# Patient Record
Sex: Female | Born: 1970 | Race: Asian | Hispanic: No
Health system: Western US, Academic
[De-identification: ages and names within clinical notes are randomized; demographics above are authoritative.]

## PROBLEM LIST (undated history)

## (undated) HISTORY — PX: PR LIG/TRNSXJ FLP TUBE ABDL/VAG APPR UNI/BI: 58600

## (undated) HISTORY — PX: ECTOPIC PREGNANCY SCREEN: ECTOP

## (undated) NOTE — Telephone Encounter (Signed)
Formatting of this note might be different from the original.  TEAM A    ACTION NEEDED (be specific):    Please review and sign refill Losartan  Pt does not have KP insurance    Action Completed:  Pended Losartan  When was this issue last addressed? (Include link to relevant encounter): Preventative Visit with Jaye Beagle, MD (12/01/2021)   Most recent plan if applicable (copy/paste):    Hypertension, unspecified type  Comment: almost due for monitoring labs. BP is well controlled  Plan: CREATININE, POTASSIUM    Relevant labs, imaging/studies, referrals, meds:   POTASSIUM (12/01/2021 9:55 AM)   CREATININE (12/01/2021 9:55 AM)   POTASSIUM (01/07/2021 11:29 AM)   CREATININE (01/07/2021 11:29 AM)     Future f/u appointments: No future appointments.    AFTER YOUR ADVICE OR ACTION, PLEASE: Return to [facility] MA Team A       Electronically signed by Teresa Coombs, MA at 12/20/2022  1:22 PM PDT

## (undated) NOTE — Telephone Encounter (Signed)
Signed Prescriptions: Disp Refills   losartan (COZAAR) 25 mg tablet 30 tab*0   Sig: Take 1 tablet (25 mg) by mouth daily  Authorizing Provider: Frederico Hamman    -------------------------------------------------------------------    Electronically signed by Frederico Hamman, MD at 12/20/2022  1:48 PM PDT

## (undated) NOTE — Telephone Encounter (Signed)
Formatting of this note might be different from the original.  Labs due for further refills.   30 day supply given.     Vickey Sages, MD  Board Certified Family Physician  [facility]    Electronically signed by Frederico Hamman, MD at 12/20/2022  1:48 PM PDT

## (undated) NOTE — Progress Notes (Signed)
 Formatting of this note is different from the original.    Subjective:  This 66 year old female presents today for a well adult visit.    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     No past medical history on file.    Past Surgical History:   Procedure Laterality Date   ? TUBAL LIGATION  06/20/1995     Social History     Tobacco Use   ? Smoking status: Never Smoker   ? Smokeless tobacco: Never Used   Substance and Sexual Activity   ? Alcohol use: Yes     Comment: wine once a week   ? Drug use: Never   ? Sexual activity: Yes     Partners: Male     Comment: [id number]     Outpatient Medications Marked as Taking for the 01/07/21 encounter (Preventative Visit) with Cynda Acres, MD   Medication Sig Dispense Refill   ? losartan (COZAAR) 25 mg tablet Take 1 tablet (25 mg) by mouth daily 90 tablet 3     Allergies:  Allergies   Allergen Reactions   ? Metronidazole Rash   ? Penicillins Other     Pt states got sick, cant remember      Immunization History   Administered Date(s) Administered   ? *STANDARD DOSE SYRINGE* (FluLAVAL,FluZONE,FluARIX or AFLURIA) QUAD 03/16/2015   ? Moderna SARS-CoV-2 Vaccination (12 + y/o)(RED CAP) 10/18/2019, 11/21/2019   ? Tdap (Tetanus, Diphtheria, acellular Pertussis) 10/10/2017     Past medical and surgical history reviewed and updated in Epic Yes  Family history reviewed and updated in Epic Yes    QUESTIONNAIRE REVIEW:   Most Recent Health  Profile Encounter (68yr): Not Found    I have reviewed and noted areas of concern. Wellness Questionnaire entered by my staff      PHYSICAL EXAM:  Visit Vitals  BP 132/78   Pulse 66   Temp 98.1 F (36.7 C) (Temporal)   Ht 5' 2.6" (1.59 m)   Wt 152 lb (68.9 kg)   LMP 12/17/2020   SpO2 99%   BMI 27.27 kg/m     Estimated body mass index is 27.27 kg/m as calculated from the following:    Height as of this encounter: 5' 2.6" (1.59 m).    Weight as of this encounter: 152 lb (68.9 kg).    General Appearance: alert, well developed, well nourished, cooperative and in no apparent distress  SKIN: skin color, texture, and turgor normal for age; no worrisome lesions detected  HEENT: ears, nose and throat normal, neck supple without adenopathy or masses  THYROID: normal to inspection and palpation  LUNGS: normal and clear to  auscultation  CV: regular rate and rhythm and without murmur  ABDOMEN: abdomen soft, non-tender. BS normal. No masses, organomegaly  EXTREMITIES: unremarkable    BREASTS: inspection negative. No nipple discharge or bleeding. No mass, no axillary lymphadenopathy    PELVIC: Not Indicated    ASSESSMENT/PLAN:    58 year old female here for well-care visit  Problems Identified/Plan:   (Z00.00) Routine general medical examination at a health care facility  (primary encounter diagnosis)    (I10) Hypertension, unspecified type  Plan: losartan (COZAAR) 25 mg tablet    (Z12.31) Encounter for screening mammogram for malignant neoplasm of breast  Plan: MAMMOGRAPHY SCREENING 3D, BILATERAL    Information provided included:  Cancer Screening (breast, colon, cervical)  Electronically signed by Cynda Acres, MD at 01/07/2021 10:59 AM PDT

## (undated) NOTE — Telephone Encounter (Signed)
Formatting of this note might be different from the original.  losartan (COZAAR) 25 mg tablet (12/20/2022 (O))   Patient Message with Guido Sander, MA (12/20/2022)   Electronically signed by Guido Sander, MA at 12/20/2022  1:54 PM PDT

## (undated) NOTE — Progress Notes (Signed)
Formatting of this note is different from the original.  Provider needs to reconcile: None    Patient's main concern: Physical Exam    Other needs or issues hoping to address?   none    Care reminders were reviewed:  Yes: Patient Instructions provided    No orders of the defined types were placed in this encounter.    Assigned Appointment Questionnaires and Status     Assigned Questionnaires Status    REASON FOR VISIT SELECTION [facility]) Completed    WELL-CARE QUESTIONNAIRE 18-65+ Completed       Medications reviewed:       Wed Nov 10, 2020  3:51 PM       Allergies reviewed:         Nov 10, 2020  3:51 PM       Questionnaires completed: Review questionnaire responses    Smoking Tobacco Use: Never Smoker    Electronically signed by Marye Round, MA at 01/07/2021 10:59 AM PDT

## (undated) NOTE — Telephone Encounter (Signed)
Formatting of this note is different from the original.  Complaint: Patient doesn't want to provide information/Clinic call back/message provider/or doesn't fit another clinical SmartPhrase    History: Cheryl Zavala is a 25 year old patient who is calling for refill of losartan. She is no longer insured with [facility]. Pharmacy asked her to contact Dr. Andres Ege for this refill. She would like to use [facility] at [facility] at [address] [place], [place], [phone].    CNS Hot Line Criteria: None  Additional Call Information   (If red flags)  ?For your safety, we would like you to speak with a consulting nurse and they will help you get the care you need?. Transfer to [facility] CNS RN Hotline (in WDE phone book).    If patient declines speaking with a Camera operator, please provide the suggested scripting:  ?Patient safety is our number one priority and with the symptoms you are experiencing, I would strongly suggest you speak with a nurse.?    If patient still declines because they prefer to schedule an appointment, it is OK to make the appointment, but add "declined CN" to appointment notes AND send over a separate high priority TE using the SmartPhrase .[email or web address] to the In-Clinic care team LPN/MA pool listed on the CIR to alert them the patient declined CNS.     Additional Call Information     Recommended Outcome: Patient wants to speak to Care team regarding treatment or follow up, send TE    Was Member's reason for call resolved? Non-Appt Issue (TE to provider, Rx Refill, Referral Request, Covid Results/Testing, etc)    Final Outcome: Sent a TE to Care Team /  [facility]    Do you need to send this TE to the Care Team? Yes:     PCP: No primary care provider on file.  NO PRIMARY CARE CLINIC      Additional Comments: None.      Electronically signed by Sanda Klein at 12/20/2022 12:43 PM PDT

---

## 2006-01-11 ENCOUNTER — Encounter (INDEPENDENT_AMBULATORY_CARE_PROVIDER_SITE_OTHER): Payer: Self-pay | Admitting: Physician Assistant

## 2006-01-11 ENCOUNTER — Ambulatory Visit (INDEPENDENT_AMBULATORY_CARE_PROVIDER_SITE_OTHER): Payer: PPO | Admitting: Physician Assistant

## 2006-01-11 VITALS — BP 102/76 | HR 72 | Temp 98.2°F | Resp 12 | Ht 62.5 in | Wt 132.0 lb

## 2006-01-11 LAB — PR U/A AUTO W/MICRO, ONSITE
Bilirubin (Indirect): NEGATIVE
Casts, URN: NEGATIVE /LPF
Crystals, URN: NEGATIVE /LPF
Glucose: NEGATIVE mg/dL
Ketones, URN: NEGATIVE mg/dL
Nitrite, URN: NEGATIVE
Occult Blood, URN: NEGATIVE
Protein: NEGATIVE mg/dL
RBC, URN: NEGATIVE /HPF (ref ?–2)
Specific Gravity, URN: 1.005 (ref 1.005–1.030)
Urobilinogen, URN: NORMAL E.U./dL
pH, Whole Blood: 7 (ref 5.0–8.0)

## 2006-01-11 LAB — PR WET PREP/KOH/TEST, ONSITE
BAL RBC Count: NEGATIVE
Clue Cells: NEGATIVE
Odor: NEGATIVE
Trich: NEGATIVE
Yeast, URN: NEGATIVE

## 2006-01-11 MED ORDER — CIPROFLOXACIN TABS 500 MG OR
ORAL_TABLET | ORAL | Status: AC
Start: 2006-01-11 — End: 2006-01-24

## 2006-01-11 NOTE — Progress Notes (Signed)
SUBJECTIVE:    Cheryl Zavala is a 35 year old year old female chinese speaking who presents to the clinic for the following:    1.  Had a urinary infection 2 days ago. Positive Dysuria. Had similar event last year and took left over medicine and burning is now gone.  Had some blood in urine a few days ago and that has stopped.  Now has some vaginal itching.  Slight vag d/c that is not odorous.  Denies any n/v or fever, back.    Has had some chest and back discomfort past few days.  Pt states has had a yeast infection in past and this seems similar.  Had a check up in [place] for "female organs" last year, but wants to make sure everything is okay.  Lmp 2 weeks ago.  Had what sounds like an ectopic pregnancy in [place] and the cut out the left fallopian tube.  Wants to make sure she can still get pregnant.      No past medical history on file.  There is no problem list on file for this patient.      ROS:  See hpi    I have reviewed pertinant areas of the patient's medical, surgical, social & family history; updates made as needed.    Medication list is reviewed & updated.      Allergies: none    OBJECTIVE:    VS BP 102/76  Pulse 72  Temp (Src) 98.2 F (36.8 C) (Oral)  Resp 12  Ht 5\' 2"  (1.588 m)  Wt 132 lbs (59.875 kg)  LMP 12/31/2005  General appearance:  healthy, alert, no distress  Skin:  No rashes  HEENT:  Eyes:  normal, lids appear normal, no swelling or erythema, PERRLA, EOM's intact and sclerae white.  Oropharynx:  normal.  Neck:  supple  Lungs:  clear to auscultation  CVS:  regular rate and rhythm, no murmurs, gallops or rubs, no cvat  GENITAL: Vagina and vulva are normal;  no discharge is noted.  Cervix normal. Uterus mobile, normal in size and shape without tenderness.  Adnexa normal without masses or tenderness.   Office Visit on 01/11/2006   U/A AUTO W/MICRO   Component Value Range   . Color, URN YELLOW  -    . Clarity, URN HAZY  -    . GLUCOSE NEG  NEG- (mg/dL)   . BILIRUBIN (INDIRECT) NEG  NEG-    . ketone  NEG  NEG- (mg/dL)   . SPECIFIC GRAVITY 1.005  1.005-1.030    . OCCULT BLOOD, URINE NEG  NEG-    . PH 7.0  5.0-8.0    . Protein NEG  NEG-TRACE- (mg/dL)   . urobil NORMAL  NORMAL- (E.U./dL)   . nitrite NEG  NEG-    . leukocytes TRACE  NEG-    . RBC, URINE NEG  NEG(0-2)- (/HPF)   . WBC, URINE 1+ (*) NEG(0-5)- (/HPF)   . EPITHELIAL CELLS, URINE 2+  - (/LPF)   . BACTERIA TRACE  - (/HPF)   . CASTS, URINE NEG  - (/LPF)   . CRYSTALS, URINE NEG  - (/LPF)   WET PREP/KOH/TEST ONSITE   Component Value Range   . Odor NEG  -    . WBC RARE  -    . RBC NEG  -    . BACTERIA 1+  -    . Clue Cells NEG  -    . yeast NEG  -    .  EPITHELIAL CELLS 2+  -    . trich NEG  -           ASSESSMENT:  1.    Diagnoses   Dx Name Primary Encounter Dx   . DYSURIA   . SCREENING MAL NEOP-CERVIX   . VAGINITIS NOS      dysuria/uti partially tx'd w/ unknown antibiotic.  Pap smear done    PLAN:  1. Tx as ordered.  Advised of above dx and to resume antibiotics. F/u if no improvement in 2-3 days, sooner if symtpoms worsening.  Pap and gcct send out as well as ua cx & s.  Advised having one fallopian tube removed would not prevent pregnancy.  The patient indicates understanding of these issues and agrees with the plan.

## 2006-01-11 NOTE — Progress Notes (Addendum)
Addended by: Eustace Pen on: 01/11/2006 1:12:55 PM     Modules accepted: Orders

## 2006-01-12 LAB — GC&CHLAM NUCLEIC ACID DETECTN

## 2006-01-12 LAB — URINE C/S

## 2006-01-18 LAB — CERVICAL CANCER SCREENING: Cytologic Impression: NEGATIVE

## 2006-04-26 ENCOUNTER — Telehealth (INDEPENDENT_AMBULATORY_CARE_PROVIDER_SITE_OTHER): Payer: Self-pay | Admitting: Physician Assistant

## 2006-04-26 NOTE — Telephone Encounter (Signed)
Pt states says she always feels like she has to urinate, feels like vaginal area is swelling same as last time. Belives she has urinary infection same as 01/11/06. Can you refill RX?

## 2006-04-26 NOTE — Telephone Encounter (Signed)
Patient scheduled appointment at 10:45 am, Saturday with Dr Farris Has.

## 2006-04-26 NOTE — Telephone Encounter (Signed)
Pt should be seen prior to any antibiotic tx.

## 2006-04-26 NOTE — Telephone Encounter (Signed)
Message left for patient to return my call.  

## 2006-04-26 NOTE — Telephone Encounter (Signed)
REFILL CALL  PCP: Spero Curb, PA  Prescribing Provider:  Spero Curb, PA  Medication(s) requested: Ciproflaxin  Dose taking now: 500MG   Reason for request: refill, pt states that she believes she has another UTI  Written prescription needed? NO  To Pharmacy Name:  [facility]  Pharmacy location: [facility]  Pharmacy phone #:[phone]  Daytime call back number:   Mobile [phone]    After 5: same Saturday: same   Okay to leave detailed VM or msg with someone at any of these numbers: YES    TSR - Please inform patient that refills typically take 1-2 business days and to call their pharmacy to verify status. Patient has been informed of the 1-2 business day refill rule.

## 2006-04-28 ENCOUNTER — Encounter (INDEPENDENT_AMBULATORY_CARE_PROVIDER_SITE_OTHER): Payer: Self-pay | Admitting: Family Medicine

## 2006-04-28 ENCOUNTER — Ambulatory Visit (INDEPENDENT_AMBULATORY_CARE_PROVIDER_SITE_OTHER): Payer: PPO | Admitting: Family Medicine

## 2006-04-28 VITALS — BP 110/70 | HR 74 | Temp 97.8°F | Resp 16 | Wt 136.0 lb

## 2006-04-28 LAB — PR U/A AUTO W/MICRO, ONSITE
Bilirubin (Indirect): NEGATIVE
Casts, URN: NEGATIVE /LPF
Crystals, URN: NEGATIVE /LPF
Epithelial Cells, URN: NEGATIVE /LPF
Glucose: NEGATIVE mg/dL
Ketones, URN: NEGATIVE mg/dL
Nitrite, URN: NEGATIVE
Protein: NEGATIVE mg/dL
RBC, URN: NEGATIVE /HPF (ref ?–2)
Specific Gravity, URN: 1.005 (ref 1.005–1.030)
Urobilinogen, URN: NORMAL E.U./dL
pH, Whole Blood: 5.5 (ref 5.0–8.0)

## 2006-04-28 MED ORDER — MACROBID 100 MG OR CAPS
ORAL_CAPSULE | ORAL | Status: AC
Start: 2006-04-28 — End: 2006-05-04

## 2006-04-28 NOTE — Progress Notes (Signed)
SUBJECTIVE: Cheryl Zavala is a 35 year old female who complains of urinary frequency, urgency and dysuria x 2 weeks, worse the past 2-3 days, without flank pain,  fever, chills, or abnormal vaginal discharge or bleeding. She denies vaginal itching or discharge, but has persistent lump left vaginal opening area, worse when she wears tight pants, and this causes stinging pain periodically    PMH of similar? Last year 3 timees and this time would be the 2nd this year    OBJECTIVE: Appears well, in no apparent distress.  Vital signs are normal. The abdomen is soft without tenderness, guarding, mass, rebound or organomegaly. No CVA tenderness or inguinal adenopathy noted.     Urine dipstick shows apparent contamination, trace leukocytes but mostly epithelial cells.    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    PLAN: Treatment per orders pending culture, but encouraged to do sitz baths.  Relevant CRS handout(s) reviewed with patient.   Advised to follow up as needed and for consideration of drainage of bartholin gland cyst if these symptoms worsen or fail to improve as anticipated.

## 2006-04-30 ENCOUNTER — Telehealth (INDEPENDENT_AMBULATORY_CARE_PROVIDER_SITE_OTHER): Payer: Self-pay | Admitting: Family Medicine

## 2006-04-30 LAB — URINE C/S: Colony Count: 400

## 2006-04-30 NOTE — Telephone Encounter (Signed)
Pt returned call. I relayed the below information to her. Pt understood but would like another call back due to Dr Sherren Kerns appt times in the near future not working with pt's schedule.

## 2006-04-30 NOTE — Telephone Encounter (Signed)
Message left for patient to return my call.  

## 2006-04-30 NOTE — Telephone Encounter (Signed)
Pt states she misunderstood relayed message below and thought she had to make an appt. Pt states she is not having any UTI syptoms and should she continue with the RX?

## 2006-04-30 NOTE — Telephone Encounter (Signed)
Please inform the patient that 'Your urine culture came back as negative, so your symptoms seem to be coming only from the bartholin gland cyst which should be managed as discussed during your office visit'. If patient has any additional questions regarding this message please make a follow-up appointment to discuss in more detail.  Thanks.

## 2006-04-30 NOTE — Telephone Encounter (Signed)
Left message for pt to stop taking antibiotics. Call us is syptoms return.

## 2006-04-30 NOTE — Telephone Encounter (Signed)
Ok to stop antibiotics

## 2006-05-18 ENCOUNTER — Ambulatory Visit (INDEPENDENT_AMBULATORY_CARE_PROVIDER_SITE_OTHER): Payer: PPO | Admitting: Internal Medicine

## 2006-05-24 ENCOUNTER — Ambulatory Visit (INDEPENDENT_AMBULATORY_CARE_PROVIDER_SITE_OTHER): Payer: PPO | Admitting: Nurse Practitioner

## 2006-05-24 VITALS — BP 114/80 | HR 84 | Temp 98.5°F | Resp 12 | Wt 139.0 lb

## 2006-05-24 MED ORDER — METRONIDAZOLE 500 MG OR TABS
ORAL_TABLET | ORAL | Status: AC
Start: 2006-05-24 — End: 2006-05-30

## 2006-05-24 MED ORDER — LEVOFLOXACIN 500 MG OR TABS
ORAL_TABLET | ORAL | Status: DC
Start: 2006-05-24 — End: 2007-03-20

## 2006-05-24 NOTE — Progress Notes (Signed)
SUBJECTIVE:  Onset of cyst 1 month ago, swollen, pain and cyst almost gone. Feel like a pimp around vagina now. Postive: pain with sex for 1 week, no condoms or other birth controll used, one steady new partner for 5 months and this partner has HSV. Lifetime partners 20, never had a STI before, never tested for HIV,    Used OTC yeast med and itching is better, no discharge, no odor.  ROS:  Negative: fever, chills, fatigue, weight change, N/V,     OBJECTIVE:  CONSTITUTIONAL/GENERAL:  Appearance:  Well developed, appearing stated age and in no acute distress,  Facial features:     External ears and nose are normal in appearance without significant scars or asymmetry., Weight appears normal for height, Gait and station:  Normal..  GENITOURINARY FEMALE:  External genitalia and vagina:  Normal external genitals.  Urethra is normal.  Vagina is normal in appearance with normal mucosa.  Bladder is normal.  Cervix is non-tendre without lesions or discharge., Uterus:  Normal contour and position.  Normal mobility and consistency. No masses or tenderness., Adnexa:  No masses or tenderness    ASSESSMENT/PLAN:  1) PID- Start Levoquin 500mg  PO q Day x 14 days & metronidazole 500mg  PPO BID x 14 days, avoid sex while taking meds, then use condoms, have partner tested and treated for STIs. Handout given.  2) Bartholins Gland- handout given, reinforced warm compress, sitz bath, if increases in size or pain may need to refer for drainage.  Dx, Tx, risks and alternatives discussed with patient and questions answered. Return to clinic if symptoms increase, persist, or worsen.    Follow up: 3 weeks, schedule a well woman exam.

## 2006-05-25 LAB — GC&CHLAM NUCLEIC ACID DETECTN
Chlam Trachomatis Nucleic Acid: NEGATIVE
N.Gonorrhoeae(GC) Nucleic Acid: NEGATIVE

## 2006-05-25 NOTE — Progress Notes (Addendum)
Addended by: Marshall Cork on: 05/25/2006 3:47:49 PM     Modules accepted: Orders

## 2006-05-25 NOTE — Progress Notes (Addendum)
Comment: CMT+    Change  O: Female: CMT+

## 2006-05-25 NOTE — Progress Notes (Addendum)
Comment: CMT+    Change  O: Female: CMT+, no lesion or purulent discharge.

## 2006-06-18 ENCOUNTER — Ambulatory Visit (INDEPENDENT_AMBULATORY_CARE_PROVIDER_SITE_OTHER): Payer: PPO | Admitting: Nurse Practitioner

## 2006-06-18 ENCOUNTER — Encounter (INDEPENDENT_AMBULATORY_CARE_PROVIDER_SITE_OTHER): Payer: Self-pay | Admitting: Nurse Practitioner

## 2006-06-18 VITALS — BP 110/70 | HR 72 | Temp 98.3°F | Resp 12 | Wt 134.0 lb

## 2006-06-18 MED ORDER — TRIAMCINOLONE ACETONIDE 0.1 % EX OINT
TOPICAL_OINTMENT | CUTANEOUS | Status: DC
Start: 2006-06-18 — End: 2007-03-20

## 2006-06-18 NOTE — Progress Notes (Addendum)
Addended by: Johna Roles on: 06/18/2006 2:41:19 PM     Modules accepted: Orders

## 2006-06-18 NOTE — Progress Notes (Signed)
Cheryl Zavala is a 35 year old female for an annual health maintenance exam. Specific concerns today include the following:     Physical exam without GYN. Last GYN 7/07 and normal.    Health Habits:    Diet: "healthy" diet in general  Calcium intake:  dietary sources: 2-3  supplements: YES: iron. Omega 3,      Vitamins: grape send  Exercise: yes, engages in regular exercise    Health Maintenance:   Unsure   Last eye exam: 6/06    Last dental exam: 12/07    There is no immunization history on file for this patient.   Any due? NO --       Current outpatient prescriptions   Medication Sig   . LEVOFLOXACIN 500 MG OR TABS one by munth daily for 14 days           There is no problem list on file for this patient.      Past Medical History   Diagnosis Date   . ECTOPIC PREGNANCY AFF NB          Past Surgical History   Procedure Date   . Ligate fallopian tube    . Ectopic pregnancy screen    . Removal of fallopian tube 1997     left side         Obstetric History     The patient has not been asked about pregnancy.         Family History   Problem Relation   . Hypertension Father         History   Social History   . Marital Status: Married     Spouse Name: N/A     Number of Children: N/A   . Years of Education: N/A   Occupational History   . Not on file.   Social History Main Topics   . Tobacco Use: Never   . Alcohol Use: Not on file   . Drug Use: Not on file   . Sexually Active: Not on file   Other Topics Concern   . Not on file   Social History Narrative    Primary language is Mandarin [place], but speaks English well, if not fluently.         Review Of Systems  Weight: stable  General:   Denies wt loss, fevers, night sweats, fatigue.  Appetite good. Sleep OK.  Skin:   Left FA new or worrisome lesions;  ENT:  Denies sinus problems, allergies, post-nasal drip;  Eyes:   no visual problems nor changes;  Resp: denies cough, dyspnea with exertion or at rest, snoring,  history of asthma or wheezing;  CV:   denies chest pain or  pressure with exertion, orthopnea, nocturnal dyspnea,  palpitations, lightheadedness, edema;  GI:   denies indigestion, heartburn, constipation, diarrhea, recent change in bowel habits, blood in stool, melena, abdominal pain;  GU:   denies urgency, frequency, nocturia, change in force of stream, incontinence, discharge or signs of STD;   Patient's last menstrual period was 05/29/2006.  Periods are regular q 28-30 days, lasting 5 days. Dysmenorrhea:none.   Cyclic symptoms include breast tenderness.   Intermenstrual bleeding, spotting, or discharge:NO --   Sexual activity: yes, single partner, contraception - Natural family planning  History of abnormal Pap smear: No  History of STD: none known  Regular self breast exam: NO  History of abnormal mammogram: NO    MS:   denies neck or back pain or  stiffness, joint pain or stiffness, pain with movement of extremities, joint swelling or redness;  Neuro:   denies signs of TIAs, numbness or tingling, balance problems, dizziness  Psych:   denies feeling depressed or anxious.       PHYSICAL EXAM:    General: healthy, alert, no distress  Skin: Skin color, texture, turgor normal. No rashes or concerning lesions., left forarm with 2cm x 1.5cm dry scaly patch, flesh colored with good cap refill.  Head: Normocephalic. No masses, lesions, tenderness or abnormalities  Eyes: Lids/periorbital skin normal, Conjunctivae/corneas clear, PERRL, EOM's intact  Ears: External ears normal. Canals clear. TM's normal.  Nose: normal  Oropharynx: normal  Dentition:   normal dentition for age  normal dentition    Neck: Neck supple. No adenopathy. Thyroid symmetric, normal size, without nodules  Lungs: clear to auscultation and no cough or wheeze with forced expiration  Heart: no murmurs, clicks, or gallops and regular rate and rhythm  Breasts: No obvious deformity or mass to inspection, nipples everted bilaterally, no skin lesion or nipple discharge, no mass palpated, no axillary  lymphadenopathy  Abd: Abdomen soft, non-tender. BS normal. No masses or organomegaly  GU: deferred, External genitalia and vagina normal  Rectal: DRE deferred  Spine:  BACK:, standing: pelvis level, spine straight, standing posture: normal curvature lumbar, thoracic and extension full, no pain  Ext: Normal, without deformities, edema, or skin discoloration  Neuro:  Grossly normal to observation, strength intact all four extremities, patellar and biceps brachii reflexes 2+ bilaterally, gait normal      ASSESSMENT AND PLAN:    V70.0 ROUTINE MEDICAL EXAM  (primary encounter diagnosis)  Plan: CBC (HEMOGRAM), LIPID PANEL, COMPREHENSIVE         METABOLIC PANEL, THYROID STIMULATING HORMONE        Next pap due 7/08, reinforce SBE, exercise and healthy diet    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 Return to clinic if symptoms increase, persist, or worsen.

## 2006-06-20 LAB — LIPID PANEL
Cholesterol (LDL): 84 mg/dL (ref <130)
Cholesterol/HDL Ratio: 2.7
HDL Cholesterol: 53 mg/dL (ref >40)
Total Cholesterol: 145 mg/dL (ref <200)
Triglyceride: 41 mg/dL (ref <150)

## 2006-06-20 LAB — COMPREHENSIVE METABOLIC PANEL
ALT (GPT): 15 U/L (ref 6–40)
AST (GOT): 19 U/L (ref 15–40)
Albumin: 3.7 g/dL (ref 3.5–5.2)
Alkaline Phosphatase (Total): 48 U/L (ref 25–112)
Anion Gap: 4 — ABNORMAL LOW (ref 5–15)
Bilirubin (Total): 0.8 mg/dL (ref 0.2–1.3)
Calcium: 9.1 mg/dL (ref 8.9–10.2)
Carbon Dioxide, Total: 29 mEq/L (ref 22–32)
Chloride: 106 mEq/L (ref 98–108)
Creatinine: 0.8 mg/dL (ref 0.3–1.2)
GFR, Calc, African American: >60 mL/min (ref >59)
GFR, Calc, European American: >60 mL/min (ref >59)
Glucose: 86 mg/dL (ref 62–125)
Potassium: 3.9 mEq/L (ref 3.7–5.2)
Protein (Total): 6.6 g/dL (ref 6.0–8.2)
Sodium: 139 mEq/L (ref 136–145)
Urea Nitrogen: 8 mg/dL (ref 8–21)

## 2006-06-20 LAB — THYROID STIMULATING HORMONE: Thyroid Stimulating Hormone: 1.323 u[IU]/mL (ref 0.400–5.00)

## 2006-06-25 ENCOUNTER — Ambulatory Visit (INDEPENDENT_AMBULATORY_CARE_PROVIDER_SITE_OTHER): Payer: PPO | Admitting: Nurse Practitioner

## 2006-06-25 ENCOUNTER — Encounter (INDEPENDENT_AMBULATORY_CARE_PROVIDER_SITE_OTHER): Payer: Self-pay | Admitting: Nurse Practitioner

## 2006-06-27 ENCOUNTER — Telehealth (INDEPENDENT_AMBULATORY_CARE_PROVIDER_SITE_OTHER): Payer: Self-pay | Admitting: Nurse Practitioner

## 2006-06-27 NOTE — Telephone Encounter (Signed)
Pt c/b, TSR unable to reach MA; pls c/b @ [phone], detailed VM OK - PLEASE!

## 2006-06-27 NOTE — Telephone Encounter (Signed)
Staff Message copied by Jarvis Newcomer on Wed Jun 27, 2006 5:27 PM  ------   Message from: Johna Roles   Created: Thu Jun 21, 2006 4:06 PM    CBC sample old ,cancelled at [facility].

## 2006-06-27 NOTE — Telephone Encounter (Signed)
Please call pt. And let her know 'we need another blood draw, as the CBC drawn was too old once it got to the main lab to be tested. This is not urgent but should be done within the next couple of weeks or before her next appt.'   I will place the order.

## 2006-06-27 NOTE — Telephone Encounter (Signed)
Left detailed message describing situation below pt needs to schedule labs non-fasting.

## 2006-06-27 NOTE — Telephone Encounter (Signed)
LM for pt to call back.

## 2006-07-19 ENCOUNTER — Encounter (INDEPENDENT_AMBULATORY_CARE_PROVIDER_SITE_OTHER): Payer: Self-pay | Admitting: Nurse Practitioner

## 2006-07-19 ENCOUNTER — Telehealth (INDEPENDENT_AMBULATORY_CARE_PROVIDER_SITE_OTHER): Payer: Self-pay | Admitting: Physician Assistant

## 2006-07-19 NOTE — Telephone Encounter (Signed)
Pt does not understand results letter and would like further explaination. Routing to [employer] advise.

## 2006-07-19 NOTE — Telephone Encounter (Signed)
GENERAL MESSAGE    PCP: Spero Curb, PA  Main reason for the call: Pt saw ARNP Creaser, asked if she could call her back re: results letter pt rec'd.  Call back expected: YES    Telephone Information:   Home Phone [phone]   Work Phone Not on file.   Mobile [phone]     Okay to leave detailed VM or msg with someone at any of these numbers: YES

## 2006-07-19 NOTE — Telephone Encounter (Signed)
PCP has been changed to [facility]. 07/19/06-ST

## 2006-07-19 NOTE — Telephone Encounter (Signed)
Called pt. Back and indicated all lab results normal.   Pt. States she is having an increase in headaches, encouraged her to schedule an appt. To discuss in greater detail.  Pt. Transferred to schedule appt.  Closing TE.

## 2006-07-19 NOTE — Telephone Encounter (Signed)
GENERAL MESSAGE    PCP: Spero Curb, PA  Main reason for the call: Plse chg PCP to [facility].  Call back expected: NO

## 2006-07-20 ENCOUNTER — Ambulatory Visit (INDEPENDENT_AMBULATORY_CARE_PROVIDER_SITE_OTHER): Payer: PPO | Admitting: Nurse Practitioner

## 2006-07-20 LAB — PR URINE PREGNANCY TEST HCG, ONSITE: Prelim. Pregnancy (HCG), SRM: NEGATIVE

## 2006-07-20 MED ORDER — INDOMETHACIN 25 MG OR CAPS
ORAL_CAPSULE | ORAL | Status: DC
Start: 2006-07-20 — End: 2007-03-20

## 2006-07-20 NOTE — Progress Notes (Signed)
SUBJECTIVE:  Cheryl Zavala is a 36 year old who presents today to discuss headaches onset last week, not every day "only when I have sex and especially when I have orgasm, really hurt feel like my head will break.'  Pain sharp 7/10, pressure applied to frontal aspect of head helpful. Occurred 4-5 time last week. First headache lasted a couple of hours and now last headache one day, makes feel tired. Only with orgasm and every time, not sex. Asprin helpful. Doesn't think she is pregnant but would like testing.    ROS:  Negative: fever, chills, SOB, dyspareunia,     Past Medical History   Diagnosis Date   . ECTOPIC PREGNANCY AFF NB        Past Surgical History   Procedure Date   . Ligate fallopian tube    . Ectopic pregnancy screen    . Removal of fallopian tube 1997     left side       Family History   Problem Relation   . Hypertension Father   . Psych Mother       History   Social History   . Marital Status: Married     Spouse Name: N/A     Number of Children: N/A   . Years of Education: N/A   Occupational History   . Not on file.   Social History Main Topics   . Tobacco Use: Never   . Alcohol Use: No   . Drug Use: No   . Sexually Active: Yes -- Female partners   Other Topics Concern   . Not on file   Social History Narrative    Primary language is Mandarin [place], but speaks English well, if not fluently.       OBJECTIVE:  BP 100/60  Pulse 64  Temp (Src) 98.4 F (36.9 C) (Oral)  Resp 14  Wt 132 lbs 3.2 oz (59.966 kg)  SpO2 98%  LMP 06/28/2006  CONSTITUTIONAL/GENERAL:  Appearance:  Well developed, appearing stated age and in no acute distress,  Facial features:     External ears and nose are normal in appearance without significant scars or asymmetry., Weight appears normal for height, Gait and station:  Normal..  Lungs: Bilat equal breath sounds with no rales, rhonchi or wheeze.  CVS exam: S1, S2 normal, no murmur, click, rub or gallop, regular rate and rhythm.  Mental Status: alert, oriented x 3          Cranial nerves: II - XII intact                   Cerebellar:  Gait - nl.                   Motor: ROM of all extremities normal.                  Strength 5/5 UE = LE bilaterally. No                                 atrophy, or involuntary movements.                   Sensory: Intact to light touch,sharp/dull                   DTR's:              R      L   (  2+=normal)                                 biceps              2+    2+                 patellar            2+    2+                 achilles            2+    2+    HcG: negative    ASSESSMENT/PLAN:  784.0 HEADACHE  (primary encounter diagnosis)  Note: exertional type  Plan: INDOMETHACIN 25 MG OR CAPS        Calm quite enviornment, may use medication prn pain, discussed SE  See also related orders.    Dx, Tx, risks and alternatives discussed with patient and questions answered. Return to clinic if symptoms increase, persist, or worsen.

## 2007-03-13 ENCOUNTER — Encounter (INDEPENDENT_AMBULATORY_CARE_PROVIDER_SITE_OTHER): Payer: PPO | Admitting: Nurse Practitioner

## 2007-03-20 ENCOUNTER — Encounter (INDEPENDENT_AMBULATORY_CARE_PROVIDER_SITE_OTHER): Payer: Self-pay | Admitting: Nurse Practitioner

## 2007-03-20 ENCOUNTER — Ambulatory Visit (INDEPENDENT_AMBULATORY_CARE_PROVIDER_SITE_OTHER): Payer: PPO | Admitting: Nurse Practitioner

## 2007-03-20 VITALS — BP 104/76 | HR 60 | Temp 97.5°F | Wt 133.0 lb

## 2007-03-20 MED ORDER — WELLBUTRIN 100 MG OR TABS
ORAL_TABLET | ORAL | Status: DC
Start: 2007-03-20 — End: 2007-05-22

## 2007-03-20 NOTE — Progress Notes (Signed)
Cheryl Zavala is a 36 year old female for an annual gyn exam. Other concerns today include the following:     Here for GYN, breast itchy ness around menstrual period- occurred once last couple of months. After menses fine. Now no problems. Itchy ness noted on left breast only. Doing SBE and no other concerns. Specially no lumps or mass, pain or rash.    Concerned about her lack of ability to concentrate while in school, feeling depressed for approx. 3 months. Denies SI or HI, sleeping problems. Wondering if she should be taking medication. Mother took medication for depression for years. Feeling like memory getting worse, in school and studying a lot. Feeling fatigue. After breaking up with boyfriend last year and not interested in thing like normal.    Calcium intake: 1 serving a day  Current exercise habits: gym for running and stretching 2-3 times a week  Menstrual pattern: regular   History of abnormal PAPs no  Health Care Maintenance Up to Date? YES  HIV Risk low    PREVIOUSLY ENTERED HISTORICAL INFORMATION:    There is no problem list on file for this patient.      Past Medical History   Diagnosis Date   . ECTOPIC PREGNANCY AFF NB          Past Surgical History   Procedure Date   . Ligate fallopian tube    . Ectopic pregnancy screen    . Removal of fallopian tube 1997     left side         Obstetric History     The patient has not been asked about pregnancy.         Family History   Problem Relation   . Hypertension Father   . Psych Mother         No current outpatient prescriptions on file prior to encounter.         History   Substance Use Topics   . Tobacco Use: Never   . Alcohol Use: No           SEXUALLY ACTIVE NO    BIRTH CONTROL none      PHYSICAL EXAM:  BP 104/76  Pulse 60  Temp 97.5 F (36.4 C)  Wt 133 lb (60.328 kg)  LMP 03/12/2007  General: healthy, alert, no distress  Breasts: No obvious deformity or mass to inspection, no dimpling or puckering with active contraction of the pectoralis,  nipples everted bilaterally, no skin lesion or nipple discharge, no mass palpated, no axillary lymphadenopathy  GU: External genitalia normal, Vagina normal without discharge, Urethra without abnormality or discharge, cervix normal in appearance, no CMT, no bladder tenderness, uterus normal size, shape, and consistency, no adnexal masses or tenderness  Ext: Normal, without deformities, edema, or skin discoloration, radial and DP pulses 2+ bilaterally  Neuro:  Grossly normal to observation, gait normal      ASSESSMENT  Face to face consultation regarding HPI, exam, review of differential diagnoses, rationale for decision-making and use of medications including answering all questions and concerns, lasting 35 min    296.30 DEPRESSIVE DISORDER RECURRENT,NOS  (primary encounter diagnosis)  Plan: WELLBUTRIN 100 MG OR TABS        Discussed medication use and SE. RTC 2 weeks. Will increase dose at follow up if needed and then come in one month and if tolerating medication will prescribe XL.    V72.31 ROUTINE GYN EXAMINATION  Plan: PAP SMEAR (CYTOPATH, GYN)-[facility], SPECIMEN  HANDLING FEE DR->LAB, GC&CHLAM NUCLEIC ACID         DETECTN        Reassure pt breast exam nl, encourage regular SBE and discussed reasons for concern.    Dx, Tx, risks and alternatives discussed with patient and questions answered. Return to clinic if symptoms increase, persist, or worsen.

## 2007-03-21 LAB — GC&CHLAM NUCLEIC ACID DETECTN
Chlam Trachomatis Nucleic Acid: NEGATIVE
N.Gonorrhoeae(GC) Nucleic Acid: NEGATIVE

## 2007-03-27 LAB — CERVICAL CANCER SCREENING: Cytologic Impression: NEGATIVE

## 2007-04-04 ENCOUNTER — Ambulatory Visit (INDEPENDENT_AMBULATORY_CARE_PROVIDER_SITE_OTHER): Payer: PPO | Admitting: Nurse Practitioner

## 2007-05-22 ENCOUNTER — Ambulatory Visit (INDEPENDENT_AMBULATORY_CARE_PROVIDER_SITE_OTHER): Payer: PPO | Admitting: Nurse Practitioner

## 2007-05-22 VITALS — BP 120/80 | HR 80 | Temp 97.6°F | Wt 132.0 lb

## 2007-05-22 LAB — COMPREHENSIVE METABOLIC PANEL
ALT (GPT): 19 U/L (ref 6–40)
AST (GOT): 23 U/L (ref 15–40)
Albumin: 4 g/dL (ref 3.5–5.2)
Alkaline Phosphatase (Total): 52 U/L (ref 25–112)
Anion Gap: 6 (ref 3–11)
Bilirubin (Total): 0.9 mg/dL (ref 0.2–1.3)
Calcium: 9.1 mg/dL (ref 8.9–10.2)
Carbon Dioxide, Total: 29 mEq/L (ref 22–32)
Chloride: 103 mEq/L (ref 98–108)
Creatinine: 0.7 mg/dL (ref 0.2–1.1)
GFR, Calc, African American: >60 mL/min (ref >59)
GFR, Calc, European American: >60 mL/min (ref >59)
Glucose: 87 mg/dL (ref 62–125)
Potassium: 3.6 mEq/L — ABNORMAL LOW (ref 3.7–5.2)
Protein (Total): 6.8 g/dL (ref 6.0–8.2)
Sodium: 138 mEq/L (ref 136–145)
Urea Nitrogen: 9 mg/dL (ref 8–21)

## 2007-05-22 LAB — PR WET PREP/KOH/TEST, ONSITE
BAL RBC Count: NEGATIVE
Clue Cells: NEGATIVE
Trich: NEGATIVE

## 2007-05-22 LAB — CBC (HEMOGRAM)
Hematocrit: 37 % (ref 36–45)
Hemoglobin: 12.3 g/dL (ref 11.5–15.5)
MCH: 29.1 pg (ref 27.3–33.6)
MCHC: 33.5 g/dL (ref 32.3–35.7)
MCV: 87 fL (ref 81–98)
Platelet Count: 306 10*3/uL (ref 150–400)
RBC: 4.23 mil/uL (ref 3.80–5.00)
WBC: 7.43 10*3/uL (ref 4.3–10.0)

## 2007-05-22 LAB — PR U/A AUTO DIPSTICK ONLY, ONSITE
Bilirubin (Indirect): NEGATIVE
Glucose: NEGATIVE mg/dL
Ketones, URN: NEGATIVE mg/dL
Nitrite, URN: NEGATIVE
Protein: NEGATIVE mg/dL
Specific Gravity, URN: 1.01 (ref 1.005–1.030)
Urobilinogen, URN: NORMAL E.U./dL
pH, Whole Blood: 5 (ref 5.0–8.0)

## 2007-05-22 LAB — THYROID STIMULATING HORMONE: Thyroid Stimulating Hormone: 1.407 u[IU]/mL (ref 0.400–5.00)

## 2007-05-22 MED ORDER — DIFLUCAN 150 MG OR TABS
ORAL_TABLET | ORAL | Status: AC
Start: 2007-05-22 — End: 2007-05-23

## 2007-05-22 NOTE — Progress Notes (Signed)
SUBJECTIVE:  Cheryl Zavala is a 36 year old who presents today to discuss vaginal discharge with burning and itching. Thinking it is a yeast infection. Also noted some pain with sex and with urination. Onset 3 days, same since onset.    Feeling better and no longer taking Wellbutrin.     ROS:  Negative: fever, chills, wt change, fatigue, depression, anxiety, difficulty with sleep.  There is no problem list on file for this patient.    Past Medical History   Diagnosis Date   . ECTOPIC PREGNANCY AFF NB        Past Surgical History   Procedure Date   . Ligate fallopian tube    . Ectopic pregnancy screen    . Removal of fallopian tube 1997     left side       Family History   Problem Relation   . Hypertension Father   . Psych Mother     depression   . Cancer No Hx Of       History   Social History   . Marital Status: Married     Spouse Name: N/A     Number of Children: N/A   . Years of Education: N/A   Occupational History   . Not on file.   Social History Main Topics   . Tobacco Use: Never   . Alcohol Use: No   . Drug Use: No   . Sexually Active: Yes -- Female partner(s)   Other Topics Concern   . Not on file   Social History Narrative    Primary language is Mandarin [place], but speaks English well, if not fluently.         OBJECTIVE:  BP 120/80  Pulse 80  Temp 97.6 F (36.4 C)  Wt 132 lb (59.875 kg)  LMP 05/14/2007  CONSTITUTIONAL/GENERAL:  Appearance:  Well developed, appearing stated age and in no acute distress,  Facial features:     External ears and nose are normal in appearance without significant scars or asymmetry., Weight appears normal for height, Gait and station:  Normal..  GENITOURINARY FEMALE:  External genitalia and vagina:  Normal external genitals.  Urethra is normal.  Vagina is normal in appearance with normal mucosa.  Bladder is normal.  Cervix is non-tendre without lesions or discharge., Uterus:  Normal contour and position.  Normal mobility and consistency. No masses or tenderness., Adnexa:  No  masses or tenderness      ASSESSMENT/PLAN:  Face to face consultation regarding HPI, exam, review of differential diagnoses, rationale for decision-making and use of medications including answering all questions and concerns, lasting 25 min  698.1 PRURITUS OF GENITALIA  (primary encounter diagnosis)  Plan: WET PREP/KOH/TEST ONSITE, DIFLUCAN 150 MG OR         TABS        Discussed medication use and SE.     788.1 DYSURIA  Plan: U/A AUTO DIPSTICK ONLY, URINE C&S, GC&CHLAM         NUCLEIC ACID DETECTN, WET PREP/KOH/TEST ONSITE        Await culture. Increase fluids, wipe front to back, void after sex. Urine with 1+ leuko and trace blood    V74.5 SCREENING FOR VENEREAL DIS  Plan: GC&CHLAM NUCLEIC ACID DETECTN, WET         PREP/KOH/TEST ONSITE, HERPES TYPE 1 & 2         SEROLOGY, HEPATITIS C ANTIBODY, HIV 1&2 AB         SCREEN,  RPR            V77.1 SCREENING-DIABETES MELLITUS  Plan: COMPREHENSIVE METABOLIC PANEL          V77.0 SCREENING-THYROID DISORDER  Plan: THYROID STIMULATING HORMONE           V78.0 SCREENING-IRON DEFIC ANEMIA  Plan: CBC (HEMOGRAM)        See also related orders.    Dx, Tx, risks and alternatives discussed with patient and questions answered. Return to clinic if symptoms increase, persist, or worsen.

## 2007-05-23 LAB — GC&CHLAM NUCLEIC ACID DETECTN
Chlam Trachomatis Nucleic Acid: NEGATIVE
N.Gonorrhoeae(GC) Nucleic Acid: NEGATIVE

## 2007-05-23 LAB — HIV 1&2 AB SCREEN

## 2007-05-23 LAB — HEPATITIS C AB WITH REFLEX PCR: Hepatitis C Antibody w/Rflx PCR: NONREACTIVE

## 2007-05-23 LAB — RPR: RPR: NONREACTIVE

## 2007-05-24 LAB — HERPES TYPE 1 & 2 SEROLOGY: HSV Western Blot Result: POSITIVE

## 2007-05-24 LAB — URINE C/S
Colony Count: <100
Culture: NO GROWTH

## 2007-06-19 NOTE — Progress Notes (Addendum)
Addended by: Wilhemina Cash on: 06/19/2007 10:43:15 AM     Modules accepted: Orders

## 2007-06-24 ENCOUNTER — Telehealth (INDEPENDENT_AMBULATORY_CARE_PROVIDER_SITE_OTHER): Payer: Self-pay | Admitting: Nurse Practitioner

## 2007-06-24 NOTE — Telephone Encounter (Addendum)
RESULTS CALL  -------------  PCP: Betsy Pries Creaser, ARNP  Doctor who ordered the test(s): [facility], ARNP  Results for what test(s)?Blood  Where was the test done?: [facility]  When was test(s) done?: 05/22/07  Daytime call back number:   Telephone Information:   Home Phone [phone]   Work Phone Not on file.   Mobile [phone]    After 5: same Saturday: same   Okay to leave detailed VM or msg with someone at any of these numbers: YES

## 2007-06-24 NOTE — Telephone Encounter (Signed)
Lab letter was sent on 05/27/07 and reprinted letter and sent again today 06/24/07. Pt made aware. Closing TE

## 2007-06-26 ENCOUNTER — Ambulatory Visit (INDEPENDENT_AMBULATORY_CARE_PROVIDER_SITE_OTHER): Payer: PPO | Admitting: Nurse Practitioner

## 2007-06-26 VITALS — BP 112/76 | HR 72 | Temp 98.9°F | Wt 136.0 lb

## 2007-06-26 NOTE — Progress Notes (Signed)
SUBJECTIVE:  Cheryl Zavala is a 37 year old who presents today to discuss left breast rash. Onset 2 months and seems to be getting worse. Skin is dry, hard and getting bigger.    ROS:  Negative: fever, chills, pain, erythema, itchy, mass or lump  There is no problem list on file for this patient.    Past Medical History   Diagnosis Date   . ECTOPIC PREGNANCY AFF NB        Past Surgical History   Procedure Date   . Ligate fallopian tube    . Ectopic pregnancy screen    . Removal of fallopian tube 1997     left side       Family History   Problem Relation   . Hypertension Father   . Psych Mother     depression   . Cancer No Hx Of       History   Social History   . Marital Status: Married     Spouse Name: N/A     Number of Children: N/A   . Years of Education: N/A   Occupational History   . Not on file.   Social History Main Topics   . Tobacco Use: Never   . Alcohol Use: No   . Drug Use: No   . Sexually Active: Yes -- Female partner(s)   Other Topics Concern   . Not on file   Social History Narrative    Primary language is Mandarin [place], but speaks English well, if not fluently.         OBJECTIVE:  BP 112/76  Pulse 72  Temp 98.9 F (37.2 C)  Wt 136 lb (61.689 kg)  LMP 06/13/2007  CONSTITUTIONAL/GENERAL:  Appearance:  Well developed, appearing stated age and in no acute distress,  Facial features:     External ears and nose are normal in appearance without significant scars or asymmetry., Weight appears normal for height, Gait and station:  Normal..  Breasts: normal without suspicious masses or nipple changes or axillary nodes, examined in upright and supine position, nipples normal without inversion or discharge, no skin dimpling or peau d'orange and abnormal, side left with verrucus lesion 1300 on areola.      ASSESSMENT/PLAN:  078.10 VIRAL WARTS NOS  (primary encounter diagnosis)  Plan: DESTRUCT BENIGN LESION, 1-14      Discussed with the patient that warts are caused by a virus that   lives in the body. Many  people carry the virus, but only some will   have warts. Warts can become bigger or resolve on their own.   Treatment encourages the wart virus to go into a dormant phase, but   does not kill the virus. As a result, the wart may recur or appear   in other places. They will often spontaneously resolve within   several years. Treatment options were reviewed.   Pt elected to have liquid nitrogen treatment. Discussed anticipated   reaction of blistering, scabbing and healing that takes 2-3 weeks. A   scar may result. Liquid nitrogen applied for three 5-15 second   freezes. Pt tolerated procedure well.    See also related orders.    Dx, Tx, risks and alternatives discussed with patient and questions answered. Return to clinic if symptoms increase, persist, or worsen.

## 2007-07-05 ENCOUNTER — Ambulatory Visit (INDEPENDENT_AMBULATORY_CARE_PROVIDER_SITE_OTHER): Payer: PPO | Admitting: Nurse Practitioner

## 2007-07-05 VITALS — BP 98/64 | HR 64 | Temp 97.7°F | Wt 135.0 lb

## 2007-07-05 NOTE — Progress Notes (Signed)
SUBJECTIVE:  Cheryl Zavala is a 37 year old who presents today to discuss lesion on left breast. Lesion has decreased in size from last visit. Would like additional freezing today.    ROS:  Negative: fever, chills, pain, drainage    There is no problem list on file for this patient.    Past Medical History   Diagnosis Date   . ECTOPIC PREGNANCY AFF NB        Past Surgical History   Procedure Date   . Ligate fallopian tube    . Ectopic pregnancy screen    . Removal of fallopian tube 1997     left side       Family History   Problem Relation   . Hypertension Father   . Psych Mother     depression   . Cancer No Hx Of       History   Social History   . Marital Status: Married     Spouse Name: N/A     Number of Children: N/A   . Years of Education: N/A   Occupational History   . Not on file.   Social History Main Topics   . Tobacco Use: Never   . Alcohol Use: No   . Drug Use: No   . Sexually Active: Yes -- Female partner(s)   Other Topics Concern   . Not on file   Social History Narrative    Primary language is Mandarin [place], but speaks English well, if not fluently.         OBJECTIVE:  BP 98/64  Pulse 64  Temp 97.7 F (36.5 C)  Wt 135 lb (61.236 kg)  SpO2 99%  LMP 06/13/2007  CONSTITUTIONAL/GENERAL:  Appearance:  Well developed, appearing stated age and in no acute distress,  Facial features:     External ears and nose are normal in appearance without significant scars or asymmetry., Weight appears normal for height, Gait and station:  Normal..  SKIN EXAM: Significant findings include:  Inspection findings: left breast with verrucus lesion 1300 left areola, slight decrease in size from last visit..      ASSESSMENT/PLAN:  078.10 VIRAL WARTS NOS  (primary encounter diagnosis)  Plan: DESTRUCT BENIGN LESION, 1-14        Discussed with the patient that warts are caused by a virus that   lives in the body. Many people carry the virus, but only some will   have warts. Warts can become bigger or resolve on their own.      Treatment encourages the wart virus to go into a dormant phase, but   does not kill the virus. As a result, the wart may recur or appear   in other places. They will often spontaneously resolve within   several years. Treatment options were reviewed.   Pt elected to have liquid nitrogen treatment. Discussed anticipated   reaction of blistering, scabbing and healing that takes 2-3 weeks. A   scar may result. Liquid nitrogen applied for three 5-15 second   freezes. Pt tolerated procedure well.        See also related orders.    Dx, Tx, risks and alternatives discussed with patient and questions answered. Return to clinic if symptoms increase, persist, or worsen.

## 2007-07-09 NOTE — Progress Notes (Addendum)
Modifier 76 added to billing - pt in global period- procedure being redone.

## 2007-10-22 ENCOUNTER — Ambulatory Visit (INDEPENDENT_AMBULATORY_CARE_PROVIDER_SITE_OTHER): Payer: PPO | Admitting: Nurse Practitioner

## 2007-10-22 ENCOUNTER — Encounter (INDEPENDENT_AMBULATORY_CARE_PROVIDER_SITE_OTHER): Payer: Self-pay | Admitting: Nurse Practitioner

## 2007-10-22 VITALS — BP 116/76 | HR 65 | Temp 98.6°F | Wt 136.0 lb

## 2007-10-22 LAB — PR WET PREP/KOH/TEST, ONSITE
Clue Cells: NEGATIVE
Trich: NEGATIVE
Yeast, URN: NEGATIVE

## 2007-10-22 NOTE — Progress Notes (Signed)
SUBJECTIVE:  Cheryl Zavala is a 37 year old who presents today to discuss dysmennorhea on the right only. Onset 2 months with menses only and occurred for one day and menses is 3 days. acupuncture and heat was helpful.  No pain now. Normally menses will be 5 days and no pain.     Vaginal discharge noticed as white an odor. Sexually active but no sex for one week, partner moved to [place]. Would like to be pregnant and wants to make sure right tube is not blocked. Concerned she might not get pregnant. Has been trying for one month.    ROS:  Negative: fever, chills, n/v, abdominal pain, dyspareunia, dysuria  There is no problem list on file for this patient.    Past Medical History   Diagnosis Date   . ECTOPIC PREGNANCY AFF NB        Past Surgical History   Procedure Date   . Ligate fallopian tube    . Ectopic pregnancy screen    . Removal of fallopian tube 1997     left side       Family History   Problem Relation   . Hypertension Father   . Psych Mother     depression   . Cancer No Hx Of       History   Social History   . Marital Status: Married     Spouse Name: N/A     Number of Children: N/A   . Years of Education: N/A   Occupational History   . Not on file.   Social History Main Topics   . Tobacco Use: Never   . Alcohol Use: No   . Drug Use: No   . Sexually Active: Yes -- Female partner(s)   Other Topics Concern   . Not on file   Social History Narrative    Primary language is Mandarin [place], but speaks English well, if not fluently.         OBJECTIVE:  BP 116/76  Pulse 65  Temp 98.6 F (37 C)  Wt 136 lb (61.689 kg)  SpO2 100%  LMP 10/21/2007  CONSTITUTIONAL/GENERAL:  Appearance:  Well developed, appearing stated age and in no acute distress,  Facial features:     External ears and nose are normal in appearance without significant scars or asymmetry., Weight appears normal for height, Gait and station:  Normal..  GENITOURINARY FEMALE:  External genitalia and vagina:  Normal external genitals.  Urethra  is normal.  Vagina is normal in appearance with normal mucosa with light brown discharge in vaginal vault.  Bladder is normal.  Cervix is non-tendre without lesions or light brown discharge., Uterus:  Normal contour and position.  Normal mobility and consistency. No masses or tenderness., Adnexa:  No masses or tenderness      ASSESSMENT/PLAN:  Face to face consultation regarding HPI, exam, review of differential diagnoses, rationale for decision-making and use of medications including answering all questions and concerns, lasting 25 min    625.3 DYSMENORRHEA  (primary encounter diagnosis)  Comment: reassurance provided, exam normal  Plan: handout from [facility] given and reviewed with pt. Encourage pt to take ibuprofen for pain with food. RTC prn.    625.9 FEMALE GENITAL SYMPTOMS NOS  Plan: WET PREP/KOH/TEST ONSITE        Discussed exam findings consistant with light menses, no yeast      See also related orders.    Dx, Tx, risks and alternatives discussed with patient and  questions answered. Return to clinic if symptoms increase, persist, or worsen.

## 2008-10-26 ENCOUNTER — Telehealth (INDEPENDENT_AMBULATORY_CARE_PROVIDER_SITE_OTHER): Payer: Self-pay | Admitting: Nurse Practitioner

## 2008-10-26 NOTE — Telephone Encounter (Signed)
Careline encounter date- 10/25/08 @ 23:54    Presenting problem - caller began having dysuria, frequency this evening.  Denies blood, flank pain.    Recommended disposition - see provider within 24 hours    Notes - will call clinic tomorroe

## 2008-10-26 NOTE — Telephone Encounter (Signed)
Noted  

## 2008-10-26 NOTE — Telephone Encounter (Signed)
Pt is in [place]. States she took an OTC medication there for dysuria which provided relief. Pt does not have dysuria today but urine is dark yellow color. Does not see hematuria at this time. Pt will be returning home late today. Appt scheduled to evaluated for UTI with J.Creaser tomorrow.

## 2008-10-27 ENCOUNTER — Ambulatory Visit (INDEPENDENT_AMBULATORY_CARE_PROVIDER_SITE_OTHER): Payer: Self-pay | Admitting: Nurse Practitioner

## 2008-10-28 ENCOUNTER — Encounter (INDEPENDENT_AMBULATORY_CARE_PROVIDER_SITE_OTHER): Payer: Self-pay | Admitting: Nurse Practitioner

## 2008-10-28 ENCOUNTER — Telehealth (INDEPENDENT_AMBULATORY_CARE_PROVIDER_SITE_OTHER): Payer: Self-pay | Admitting: Nurse Practitioner

## 2013-07-11 ENCOUNTER — Encounter (INDEPENDENT_AMBULATORY_CARE_PROVIDER_SITE_OTHER): Payer: Self-pay | Admitting: Nurse Practitioner

## 2015-03-18 ENCOUNTER — Ambulatory Visit: Payer: Self-pay

## 2015-03-18 NOTE — Telephone Encounter (Signed)
Pt is driving.  Pt asked RN to call back in 15 min.

## 2015-03-18 NOTE — Telephone Encounter (Signed)
Protocol: NO CONTACT OR DUPLICATE CONTACT CALL-ADULT-AH  Affirmative: Message left on unidentified answering machine.  Answering service notified.  Disposition of No Contact Calls  suggested.

## 2015-03-18 NOTE — Telephone Encounter (Signed)
-----   Message from [place] sent at 03/18/2015  6:35 PM PDT -----  Regarding: Cheryl Zavala: "Allergy"  >> Marva Panda 03/18/2015 06:35 PM  FAX [place]: "Allergy"

## 2019-02-01 ENCOUNTER — Other Ambulatory Visit (HOSPITAL_BASED_OUTPATIENT_CLINIC_OR_DEPARTMENT_OTHER): Payer: Self-pay

## 2021-03-30 ENCOUNTER — Other Ambulatory Visit (HOSPITAL_BASED_OUTPATIENT_CLINIC_OR_DEPARTMENT_OTHER): Payer: Self-pay

## 2023-04-25 ENCOUNTER — Other Ambulatory Visit (HOSPITAL_BASED_OUTPATIENT_CLINIC_OR_DEPARTMENT_OTHER): Payer: Self-pay | Admitting: Physician Assistant

## 2023-04-25 DIAGNOSIS — Z1231 Encounter for screening mammogram for malignant neoplasm of breast: Secondary | ICD-10-CM

## 2023-05-02 ENCOUNTER — Ambulatory Visit
Admission: RE | Admit: 2023-05-02 | Discharge: 2023-05-02 | Disposition: A | Discharge status: Home / Self Care | Payer: 59 | Attending: Body Imaging | Admitting: Body Imaging

## 2023-05-02 ENCOUNTER — Encounter (HOSPITAL_BASED_OUTPATIENT_CLINIC_OR_DEPARTMENT_OTHER): Payer: Self-pay

## 2023-05-02 DIAGNOSIS — Z1231 Encounter for screening mammogram for malignant neoplasm of breast: Secondary | ICD-10-CM | POA: Insufficient documentation

## 2023-05-03 ENCOUNTER — Other Ambulatory Visit (HOSPITAL_BASED_OUTPATIENT_CLINIC_OR_DEPARTMENT_OTHER): Payer: Self-pay

## 2023-11-30 IMAGING — CR FOOT RT 3 VWS MIN
1 series · 3 of 3 positions shown · non-contrast
Comparison: None

Right foot radiographs, 3 views
INDICATION: Pain in right foot

[Series 1: lat · 0.17mm/px · 3 of 3 slices shown]
[im 1/3]
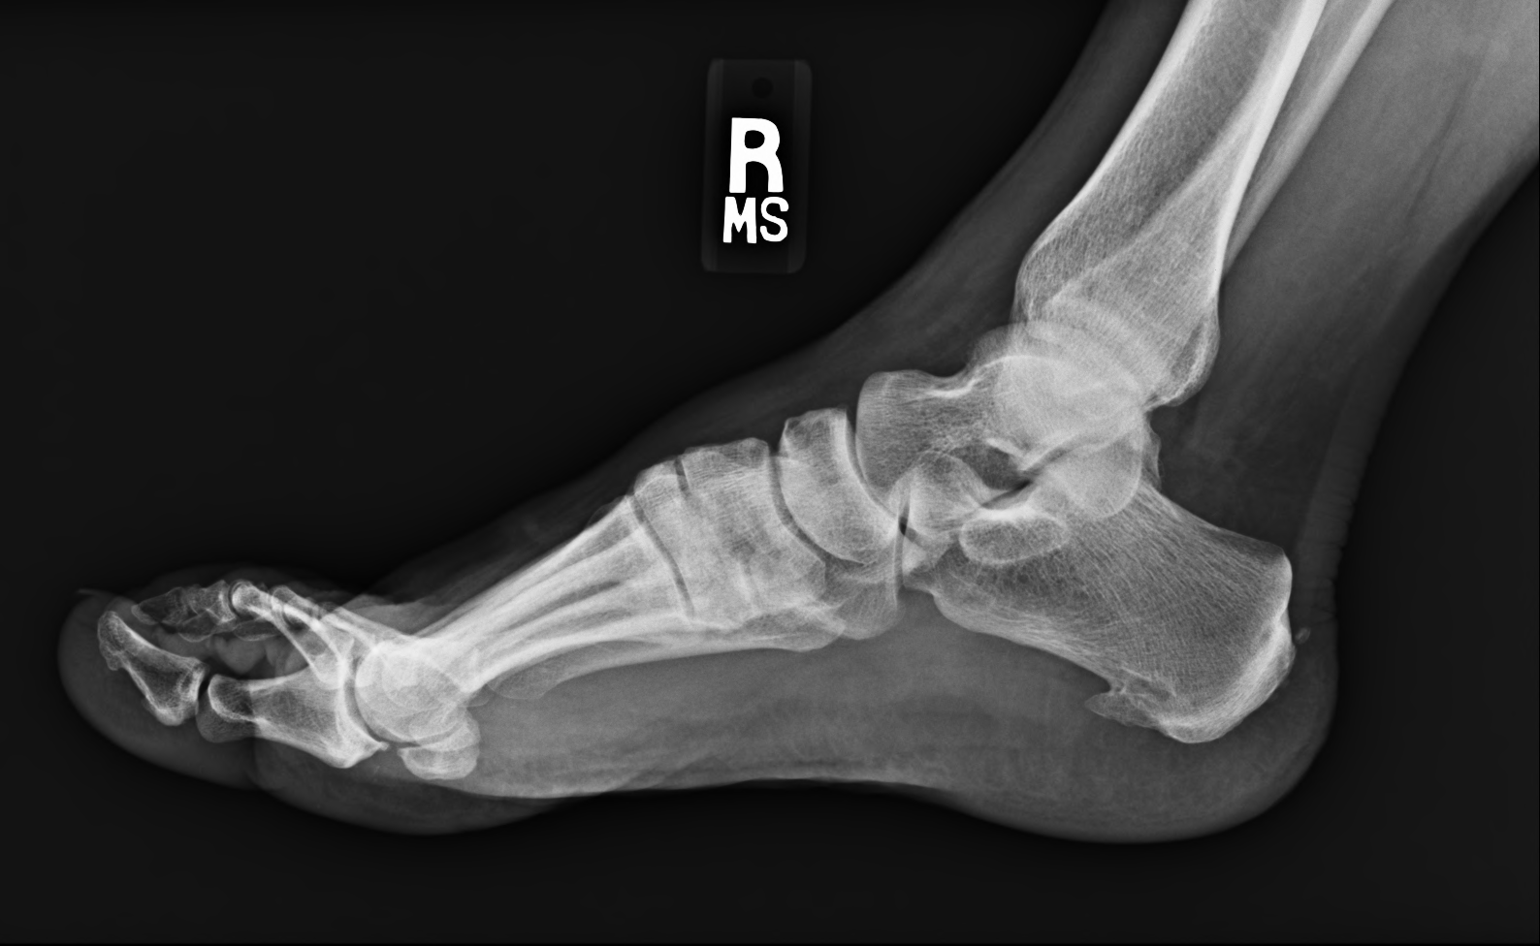
[im 2/3]
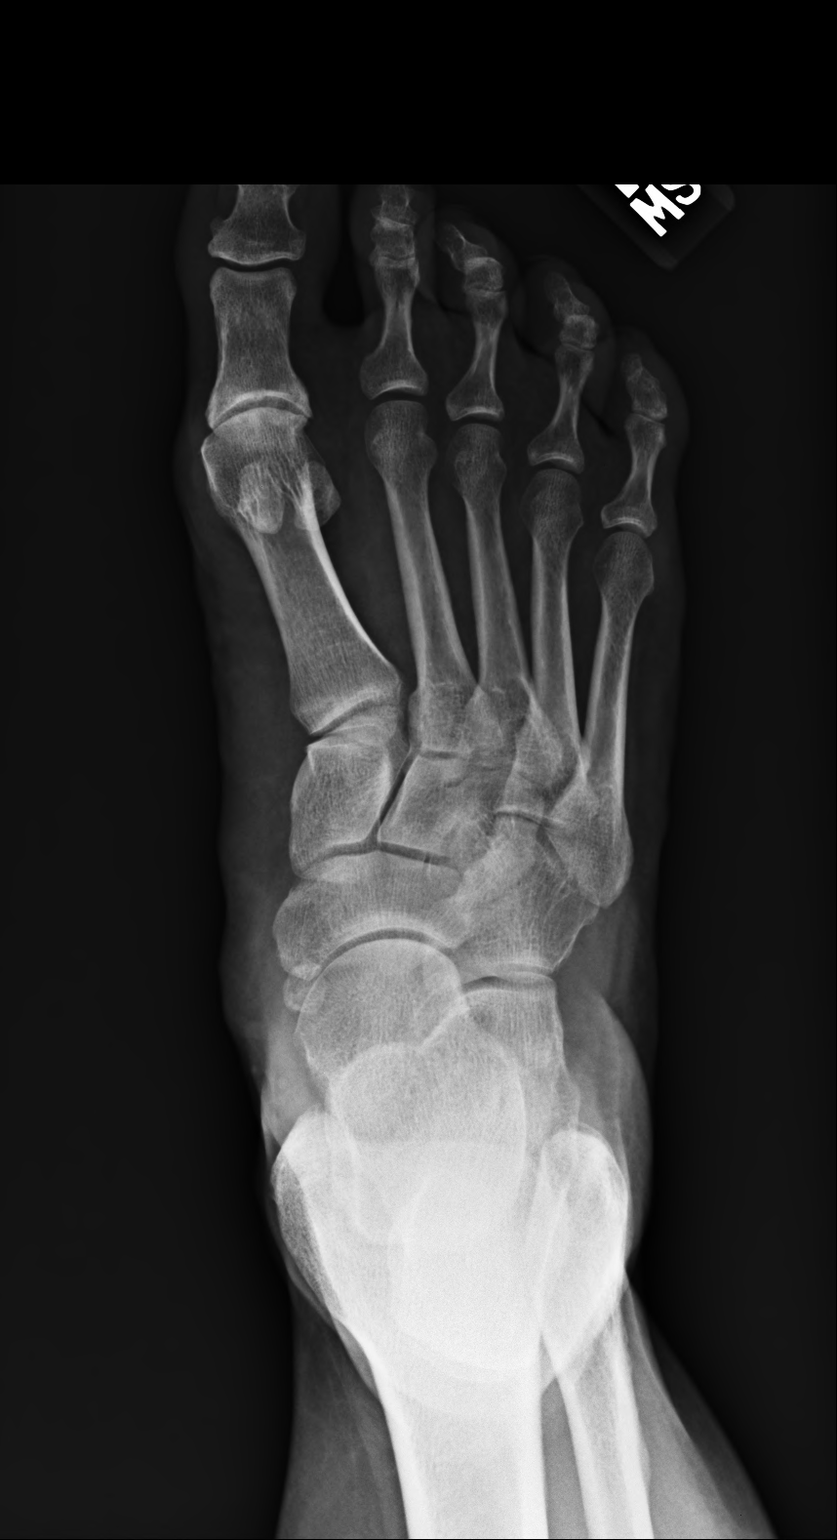
[im 3/3]
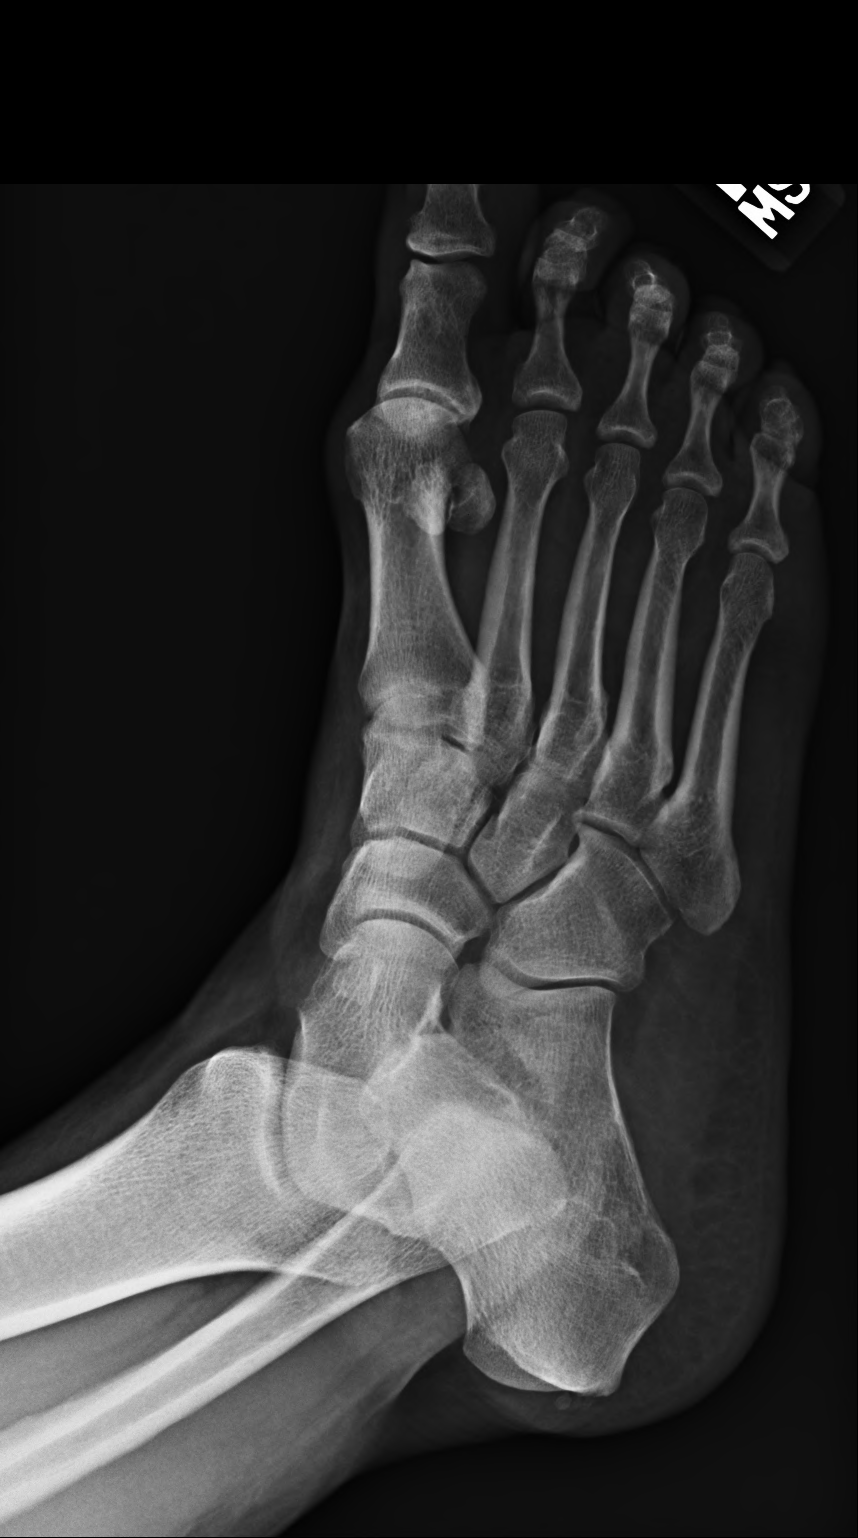

[3 of 3 positions shown; findings below may reference images not displayed]

FINDINGS: No acute fracture. No dislocation. Small calcaneal spurs. Metatarsus primus varus. Moderate first MTP joint DJD. No erosion.
IMPRESSION: 1.
Moderate first MTP joint DJD.

2.
Small calcaneal spurs.

## 2023-12-04 LAB — COLOGUARD, EXTERNAL: Cologuard Result, External: NEGATIVE
